# Patient Record
Sex: Female | Born: 1975 | Race: White | Hispanic: No | Smoking: Never smoker
Health system: Southern US, Community
[De-identification: ages and names within clinical notes are randomized; demographics above are authoritative.]

---

## 2005-04-29 ENCOUNTER — Other Ambulatory Visit: Admission: RE | Admit: 2005-04-29 | Discharge: 2005-04-29 | Payer: Self-pay | Admitting: Obstetrics and Gynecology

## 2015-02-14 ENCOUNTER — Encounter (HOSPITAL_BASED_OUTPATIENT_CLINIC_OR_DEPARTMENT_OTHER): Payer: Self-pay | Admitting: *Deleted

## 2015-02-14 ENCOUNTER — Emergency Department (HOSPITAL_BASED_OUTPATIENT_CLINIC_OR_DEPARTMENT_OTHER)
Admission: EM | Admit: 2015-02-14 | Discharge: 2015-02-14 | Disposition: A | Discharge status: Home / Self Care | Payer: Medicaid Other | Attending: Emergency Medicine | Admitting: Emergency Medicine

## 2015-02-14 ENCOUNTER — Emergency Department (HOSPITAL_BASED_OUTPATIENT_CLINIC_OR_DEPARTMENT_OTHER): Payer: Medicaid Other

## 2015-02-14 DIAGNOSIS — K529 Noninfective gastroenteritis and colitis, unspecified: Secondary | ICD-10-CM | POA: Diagnosis not present

## 2015-02-14 DIAGNOSIS — Z3202 Encounter for pregnancy test, result negative: Secondary | ICD-10-CM | POA: Diagnosis not present

## 2015-02-14 DIAGNOSIS — R1032 Left lower quadrant pain: Secondary | ICD-10-CM | POA: Diagnosis present

## 2015-02-14 LAB — URINALYSIS, ROUTINE W REFLEX MICROSCOPIC
Glucose, UA: NEGATIVE mg/dL
HGB URINE DIPSTICK: NEGATIVE
Ketones, ur: 15 mg/dL — AB
NITRITE: NEGATIVE
PH: 6 (ref 5.0–8.0)
Protein, ur: NEGATIVE mg/dL
SPECIFIC GRAVITY, URINE: 1.019 (ref 1.005–1.030)
Urobilinogen, UA: 0.2 mg/dL (ref 0.0–1.0)

## 2015-02-14 LAB — COMPREHENSIVE METABOLIC PANEL
ALT: 11 U/L — AB (ref 14–54)
AST: 14 U/L — AB (ref 15–41)
Albumin: 3.6 g/dL (ref 3.5–5.0)
Alkaline Phosphatase: 35 U/L — ABNORMAL LOW (ref 38–126)
Anion gap: 4 — ABNORMAL LOW (ref 5–15)
BUN: 8 mg/dL (ref 6–20)
CHLORIDE: 108 mmol/L (ref 101–111)
CO2: 27 mmol/L (ref 22–32)
CREATININE: 0.66 mg/dL (ref 0.44–1.00)
Calcium: 8.7 mg/dL — ABNORMAL LOW (ref 8.9–10.3)
GFR calc Af Amer: >60 mL/min (ref >60)
Glucose, Bld: 101 mg/dL — ABNORMAL HIGH (ref 65–99)
Potassium: 3.7 mmol/L (ref 3.5–5.1)
Sodium: 139 mmol/L (ref 135–145)
Total Bilirubin: 0.8 mg/dL (ref 0.3–1.2)
Total Protein: 6.4 g/dL — ABNORMAL LOW (ref 6.5–8.1)

## 2015-02-14 LAB — URINE MICROSCOPIC-ADD ON

## 2015-02-14 LAB — CBC
HCT: 37.9 % (ref 36.0–46.0)
Hemoglobin: 12.6 g/dL (ref 12.0–15.0)
MCH: 31.3 pg (ref 26.0–34.0)
MCHC: 33.2 g/dL (ref 30.0–36.0)
MCV: 94 fL (ref 78.0–100.0)
PLATELETS: 195 10*3/uL (ref 150–400)
RBC: 4.03 MIL/uL (ref 3.87–5.11)
RDW: 11.8 % (ref 11.5–15.5)
WBC: 5.4 10*3/uL (ref 4.0–10.5)

## 2015-02-14 LAB — PREGNANCY, URINE: Preg Test, Ur: NEGATIVE

## 2015-02-14 LAB — LIPASE, BLOOD: LIPASE: 173 U/L — AB (ref 11–51)

## 2015-02-14 MED ORDER — SODIUM CHLORIDE 0.9 % IV BOLUS (SEPSIS)
1000.0000 mL | Freq: Once | INTRAVENOUS | Status: AC
  Administered 2015-02-14: 1000 mL via INTRAVENOUS

## 2015-02-14 MED ORDER — IOHEXOL 300 MG/ML  SOLN
100.0000 mL | Freq: Once | INTRAMUSCULAR | Status: AC | PRN
  Administered 2015-02-14: 100 mL via INTRAVENOUS

## 2015-02-14 MED ORDER — METRONIDAZOLE 500 MG PO TABS: 500.0000 mg | ORAL_TABLET | Freq: Three times a day (TID) | ORAL | Status: AC

## 2015-02-14 MED ORDER — ONDANSETRON HCL 4 MG PO TABS: 4.0000 mg | ORAL_TABLET | Freq: Three times a day (TID) | ORAL | Status: AC | PRN

## 2015-02-14 MED ORDER — CIPROFLOXACIN HCL 500 MG PO TABS: 500.0000 mg | ORAL_TABLET | Freq: Two times a day (BID) | ORAL | Status: AC

## 2015-02-14 MED ORDER — IOHEXOL 300 MG/ML  SOLN
25.0000 mL | Freq: Once | INTRAMUSCULAR | Status: AC | PRN
  Administered 2015-02-14: 25 mL via ORAL

## 2015-02-14 MED ORDER — ONDANSETRON HCL 4 MG/2ML IJ SOLN
4.0000 mg | Freq: Once | INTRAMUSCULAR | Status: AC
  Administered 2015-02-14: 4 mg via INTRAVENOUS
  Filled 2015-02-14: qty 2

## 2015-02-14 NOTE — Discharge Instructions (Signed)
Return to the ED if your symptoms worsen, if you are unable to tolerate oral fluids or your medications, if you develop fevers, or if you develop new or concerning symptoms.

## 2015-02-14 NOTE — ED Notes (Signed)
Tolerated PO fluids well.

## 2015-02-14 NOTE — ED Notes (Signed)
Presents with abd pain x 1 week, states cont to have diarrhea

## 2015-02-14 NOTE — ED Notes (Signed)
Pt returns from radiology, VS obtained

## 2015-02-14 NOTE — ED Notes (Signed)
This past Sunday PM began having abd pain, had vomiting and diarrhea, till Monday, no vomiting since Friday, but cont to have diarrhea, pain in abd is intermittent

## 2015-02-14 NOTE — ED Notes (Signed)
Has Mirana IUD device in.

## 2015-02-14 NOTE — ED Notes (Signed)
Pt tolerating PO contrast well

## 2015-02-14 NOTE — ED Notes (Signed)
MD at bedside. 

## 2015-02-14 NOTE — ED Notes (Signed)
Patient transported to CT via stretcher, sr x 2 up  

## 2015-02-14 NOTE — ED Notes (Signed)
PO fluid challenge initiated per EDP orders

## 2015-02-14 NOTE — ED Provider Notes (Signed)
CSN: [id number]     Arrival date & time 02/14/15  4098 History   First MD Initiated Contact with Patient 02/14/15 1000     Chief Complaint  Patient presents with  . Abdominal Pain     (Consider location/radiation/quality/duration/timing/severity/associated sxs/prior Treatment) Patient is a 39 y.o. female presenting with abdominal pain.  Abdominal Pain Pain location:  LLQ Pain quality: cramping   Pain radiates to:  Does not radiate Pain severity:  Moderate Onset quality:  Gradual Duration:  1 week Timing:  Intermittent Progression:  Unchanged Chronicity:  New Context comment:  Ate oysters a little over a week ago, day before symptoms began. Relieved by:  Nothing Exacerbated by: eating or drinking. Ineffective treatments:  None tried Associated symptoms: diarrhea, nausea and vomiting   Associated symptoms: no fever     History reviewed. No pertinent past medical history. History reviewed. No pertinent past surgical history. History reviewed. No pertinent family history. Social History  Substance Use Topics  . Smoking status: Never Smoker   . Smokeless tobacco: None  . Alcohol Use: Yes     Comment: sociably   OB History    No data available     Review of Systems  Constitutional: Negative for fever.  Gastrointestinal: Positive for nausea, vomiting, abdominal pain and diarrhea.  All other systems reviewed and are negative.     Allergies  Percocet  Home Medications   Prior to Admission medications   Not on File   BP 109/69 mmHg  Pulse 84  Temp(Src) 98 F (36.7 C) (Oral)  Resp 16  Ht  (1.702 m)  Wt 145 lb (65.772 kg)  BMI 22.71 kg/m2  SpO2 100%  LMP 01/15/2015 (Approximate) Physical Exam  Constitutional: She is oriented to person, place, and time. She appears well-developed and well-nourished. No distress.  HENT:  Head: Normocephalic and atraumatic.  Mouth/Throat: Oropharynx is clear and moist.  Eyes: Conjunctivae are normal. Pupils are equal,  round, and reactive to light. No scleral icterus.  Neck: Neck supple.  Cardiovascular: Normal rate, regular rhythm, normal heart sounds and intact distal pulses.   No murmur heard. Pulmonary/Chest: Effort normal and breath sounds normal. No stridor. No respiratory distress. She has no rales.  Abdominal: Soft. Bowel sounds are normal. She exhibits no distension. There is tenderness in the left upper quadrant and left lower quadrant. There is no rigidity, no rebound and no guarding.  Musculoskeletal: Normal range of motion.  Neurological: She is alert and oriented to person, place, and time.  Skin: Skin is warm and dry. No rash noted.  Psychiatric: She has a normal mood and affect. Her behavior is normal.  Nursing note and vitals reviewed.   ED Course  Procedures (including critical care time) Labs Review Labs Reviewed  URINALYSIS, ROUTINE W REFLEX MICROSCOPIC (NOT AT [facility]) - Abnormal; Notable for the following:    Color, Urine AMBER (*)    APPearance CLOUDY (*)    Bilirubin Urine SMALL (*)    Ketones, ur 15 (*)    Leukocytes, UA MODERATE (*)    All other components within normal limits  LIPASE, BLOOD - Abnormal; Notable for the following:    Lipase 173 (*)    All other components within normal limits  COMPREHENSIVE METABOLIC PANEL - Abnormal; Notable for the following:    Glucose, Bld 101 (*)    Calcium 8.7 (*)    Total Protein 6.4 (*)    AST 14 (*)    ALT 11 (*)  Alkaline Phosphatase 35 (*)    Anion gap 4 (*)    All other components within normal limits  URINE MICROSCOPIC-ADD ON - Abnormal; Notable for the following:    Squamous Epithelial / LPF FEW (*)    Bacteria, UA MANY (*)    All other components within normal limits  URINE CULTURE  PREGNANCY, URINE  CBC    Imaging Review Ct Abdomen Pelvis W Contrast  02/14/2015  CLINICAL DATA:  Right lower quadrant pain with nausea, vomiting and diarrhea 5 days. EXAM: CT ABDOMEN AND PELVIS WITH CONTRAST TECHNIQUE: Multidetector  CT imaging of the abdomen and pelvis was performed using the standard protocol following bolus administration of intravenous contrast. CONTRAST:  100mL OMNIPAQUE IOHEXOL 300 MG/ML SOLN, 25mL OMNIPAQUE IOHEXOL 300 MG/ML SOLN COMPARISON:  None. FINDINGS: Lung bases are normal. Abdominal images demonstrate a couple small well-defined hypodensities over the left lobe of the liver likely cysts. The spleen, pancreas, gallbladder and adrenal glands are normal. Kidneys are within normal. Vascular structures are normal. Appendix is normal. There wall thickening of the terminal ileum with irregular fold thickening of multiple ileal loops over the pelvis. No evidence of small bowel obstruction. Mild free fluid over the right abdomen and right lower quadrant. Colon is unremarkable. No evidence of adenopathy. Pelvic images demonstrate adequate positioning of an IUD. There is mild moderate free fluid. Bladder, ovaries and rectum are within normal. Remaining bones and soft tissues are unremarkable. IMPRESSION: Wall thickening of the terminal ileum with irregular thickened folds of multiple ileal loops over the lower abdomen/ pelvis. No evidence of bowel obstruction. Mild associated free fluid over the right mid to lower abdomen and mild to moderate free pelvic fluid. Findings are nonspecific and may be due to Crohn's disease or other inflammatory processes, infectious enteritis or less likely neoplastic disease such as lymphoma/metastatic disease. Couple small liver hypodensities likely cysts. Electronically Signed   By: Elberta Fortisaniel  Boyle M.D.   On: 02/14/2015 13:05   I have personally reviewed and evaluated these images and lab results as part of my medical decision-making.   EKG Interpretation None      MDM   Final diagnoses:  Enteritis    CT showed evidence of ileitis.  She has remained well appearing, nontoxic, with soft abdomen during her ED course.  Her lipase is elevated, but she has no epigastric pain and no  evidence of pancreatitis on CT.    Due to persistent nature of symptoms, plan to start Cipro and Flagyl.  She will follow up with PCP within a few days.  Return precautions given.    Blake DivineJohn Nyisha Clippard, MD 02/14/15 [phone]
